# Patient Record
Sex: Female | Born: 1985 | Race: White | Hispanic: No | Marital: Single | Smoking: Never smoker
Health system: Southern US, Community
[De-identification: ages and names within clinical notes are randomized; demographics above are authoritative.]

## PROBLEM LIST (undated history)

## (undated) DIAGNOSIS — Q059 Spina bifida, unspecified: Secondary | ICD-10-CM

## (undated) HISTORY — PX: LUMBAR LAMINECTOMY FOR TETHERED CORD RELEASE: SHX1982

## (undated) HISTORY — PX: OTHER SURGICAL HISTORY: SHX169

---

## 2018-03-26 ENCOUNTER — Encounter (HOSPITAL_COMMUNITY): Payer: Self-pay

## 2018-03-26 ENCOUNTER — Emergency Department (HOSPITAL_COMMUNITY): Payer: Medicaid Other

## 2018-03-26 ENCOUNTER — Emergency Department (HOSPITAL_COMMUNITY)
Admission: EM | Admit: 2018-03-26 | Discharge: 2018-03-26 | Disposition: A | Discharge status: Home / Self Care | Payer: Medicaid Other | Attending: Emergency Medicine | Admitting: Emergency Medicine

## 2018-03-26 ENCOUNTER — Other Ambulatory Visit: Payer: Self-pay

## 2018-03-26 DIAGNOSIS — W1781XA Fall down embankment (hill), initial encounter: Secondary | ICD-10-CM | POA: Diagnosis not present

## 2018-03-26 DIAGNOSIS — R55 Syncope and collapse: Secondary | ICD-10-CM | POA: Insufficient documentation

## 2018-03-26 DIAGNOSIS — Y999 Unspecified external cause status: Secondary | ICD-10-CM | POA: Insufficient documentation

## 2018-03-26 DIAGNOSIS — Z91040 Latex allergy status: Secondary | ICD-10-CM | POA: Diagnosis not present

## 2018-03-26 DIAGNOSIS — S0990XA Unspecified injury of head, initial encounter: Secondary | ICD-10-CM

## 2018-03-26 DIAGNOSIS — W050XXA Fall from non-moving wheelchair, initial encounter: Secondary | ICD-10-CM | POA: Diagnosis not present

## 2018-03-26 DIAGNOSIS — S0083XA Contusion of other part of head, initial encounter: Secondary | ICD-10-CM | POA: Diagnosis present

## 2018-03-26 DIAGNOSIS — E876 Hypokalemia: Secondary | ICD-10-CM | POA: Diagnosis not present

## 2018-03-26 DIAGNOSIS — Y939 Activity, unspecified: Secondary | ICD-10-CM | POA: Insufficient documentation

## 2018-03-26 DIAGNOSIS — Y92838 Other recreation area as the place of occurrence of the external cause: Secondary | ICD-10-CM | POA: Insufficient documentation

## 2018-03-26 HISTORY — DX: Spina bifida, unspecified: Q05.9

## 2018-03-26 LAB — URINALYSIS, ROUTINE W REFLEX MICROSCOPIC
Bilirubin Urine: NEGATIVE
Glucose, UA: NEGATIVE mg/dL
KETONES UR: NEGATIVE mg/dL
LEUKOCYTES UA: NEGATIVE
Nitrite: NEGATIVE
PROTEIN: 30 mg/dL — AB
Specific Gravity, Urine: 1.018 (ref 1.005–1.030)
pH: 6 (ref 5.0–8.0)

## 2018-03-26 LAB — CBC WITH DIFFERENTIAL/PLATELET
BASOS ABS: 0 10*3/uL (ref 0.0–0.1)
Basophils Relative: 0 %
EOS PCT: 1 %
Eosinophils Absolute: 0.2 10*3/uL (ref 0.0–0.7)
HEMATOCRIT: 41.2 % (ref 36.0–46.0)
HEMOGLOBIN: 13.1 g/dL (ref 12.0–15.0)
LYMPHS ABS: 2.5 10*3/uL (ref 0.7–4.0)
LYMPHS PCT: 22 %
MCH: 28.4 pg (ref 26.0–34.0)
MCHC: 31.8 g/dL (ref 30.0–36.0)
MCV: 89.2 fL (ref 78.0–100.0)
Monocytes Absolute: 0.9 10*3/uL (ref 0.1–1.0)
Monocytes Relative: 8 %
NEUTROS ABS: 7.8 10*3/uL — AB (ref 1.7–7.7)
NEUTROS PCT: 69 %
Platelets: 351 10*3/uL (ref 150–400)
RBC: 4.62 MIL/uL (ref 3.87–5.11)
RDW: 14.4 % (ref 11.5–15.5)
WBC: 11.4 10*3/uL — ABNORMAL HIGH (ref 4.0–10.5)

## 2018-03-26 LAB — BASIC METABOLIC PANEL
ANION GAP: 7 (ref 5–15)
BUN: 10 mg/dL (ref 6–20)
CHLORIDE: 103 mmol/L (ref 98–111)
CO2: 26 mmol/L (ref 22–32)
Calcium: 8.5 mg/dL — ABNORMAL LOW (ref 8.9–10.3)
Creatinine, Ser: 0.41 mg/dL — ABNORMAL LOW (ref 0.44–1.00)
GFR calc Af Amer: >60 mL/min (ref >60)
GFR calc non Af Amer: >60 mL/min (ref >60)
Glucose, Bld: 102 mg/dL — ABNORMAL HIGH (ref 70–99)
POTASSIUM: 3.3 mmol/L — AB (ref 3.5–5.1)
SODIUM: 136 mmol/L (ref 135–145)

## 2018-03-26 MED ORDER — SODIUM CHLORIDE 0.9 % IV BOLUS
1000.0000 mL | Freq: Once | INTRAVENOUS | Status: AC
  Administered 2018-03-26: 1000 mL via INTRAVENOUS

## 2018-03-26 MED ORDER — POTASSIUM CHLORIDE CRYS ER 20 MEQ PO TBCR
20.0000 meq | EXTENDED_RELEASE_TABLET | Freq: Once | ORAL | Status: AC
  Administered 2018-03-26: 20 meq via ORAL
  Filled 2018-03-26: qty 1

## 2018-03-26 MED ORDER — IBUPROFEN 400 MG PO TABS
400.0000 mg | ORAL_TABLET | Freq: Once | ORAL | Status: AC
Start: 2018-03-26 — End: 2018-03-26
  Administered 2018-03-26: 400 mg via ORAL
  Filled 2018-03-26: qty 1

## 2018-03-26 NOTE — Discharge Instructions (Signed)
You were seen in the emergency department for a fainting spell in which you had a fall and facial injury.  You had a CAT scan of your head face and neck that did not show any obvious fractures.  We also did blood work and a urinalysis.  You were given IV fluids and some potassium because your potassium was a little low.  It will be important for you to follow-up with your primary care doctor because you will need further testing.  Please return if any concerns.

## 2018-03-26 NOTE — ED Notes (Signed)
IV by [facility] removed after unable to have flow of IV without pressure to site  BN, RN, CN with IV start

## 2018-03-26 NOTE — ED Provider Notes (Signed)
Pt is feeling better and is ready to go home.  She knows to return if worse.  F/u with pcp.   Jacalyn LefevreHaviland, Hilarie Sinha, MD 03/26/18 2225

## 2018-03-26 NOTE — ED Notes (Signed)
Dr H in to reassess

## 2018-03-26 NOTE — ED Provider Notes (Signed)
[facility] EMERGENCY DEPARTMENT Provider Note   CSN: [id number] Arrival date & time: 03/26/18  2035     History   Chief Complaint Chief Complaint  Patient presents with  . Fall    +LOC    HPI Jennifer Blake is a 32 y.o. female.  She is here after rolling down a hill and falling out of her wheelchair.  By standard states she was unconscious in the out place her back into the wheelchair.  Patient is unclear whether she passed out before or after the fall.  They also states she may have vomited.  Patient is wheelchair-bound due to spina bifida.  She is at a camp here where she is been outside a lot and doing a lot of physical activity.  Currently the patient denies any headache and only has some soreness of her upper lip and face.  She denies any chest pain shortness of breath headache double vision blurry vision nausea.  She states she is never passed out before.  At baseline she has no use of her legs and has to self catheterize.  The history is provided by the patient.  Fall  This is a new problem. The current episode started less than 1 hour ago. The problem has been gradually improving. Pertinent negatives include no chest pain, no abdominal pain, no headaches and no shortness of breath. Nothing aggravates the symptoms. The symptoms are relieved by rest. She has tried rest for the symptoms. The treatment provided mild relief.    History reviewed. No pertinent past medical history.  There are no active problems to display for this patient.   History reviewed. No pertinent surgical history.   OB History   None      Home Medications    Prior to Admission medications   Not on File    Family History No family history on file.  Social History Social History   Tobacco Use  . Smoking status: Not on file  Substance Use Topics  . Alcohol use: Not on file  . Drug use: Not on file     Allergies   Penicillins and Latex   Review of Systems Review of Systems    Constitutional: Negative for fever.  HENT: Negative for nosebleeds and sore throat.   Eyes: Negative for visual disturbance.  Respiratory: Negative for shortness of breath.   Cardiovascular: Negative for chest pain.  Gastrointestinal: Negative for abdominal pain.  Genitourinary: Negative for dysuria.  Musculoskeletal: Positive for neck pain. Negative for back pain.  Skin: Positive for wound. Negative for rash.  Neurological: Positive for syncope. Negative for headaches.     Physical Exam Updated Vital Signs BP (!) 137/91 (BP Location: Left Arm)   Pulse (!) 116   Temp 98.2 F (36.8 C) (Oral)   Resp 20   Ht 4\' 9"  (1.448 m)   Wt 63.5 kg   SpO2 100%   BMI 30.30 kg/m   Physical Exam  Constitutional: She is oriented to person, place, and time. She appears well-developed and well-nourished.  HENT:  Head: Normocephalic.  Right Ear: External ear normal.  Left Ear: External ear normal.  Nose: Nose normal.  Mouth/Throat: Oropharynx is clear and moist.  She has some swelling tenderness and abrasion to her upper lip and maxilla.  Eyes: Pupils are equal, round, and reactive to light. Conjunctivae and EOM are normal. Right eye exhibits no discharge. Left eye exhibits no discharge.  Neck: No tracheal deviation present.  Cardiovascular: Regular rhythm, normal heart sounds  and intact distal pulses. Tachycardia present.  Pulmonary/Chest: Effort normal. She has no wheezes. She has no rales.  Abdominal: Soft. She exhibits no mass. There is no tenderness. There is no guarding.  Musculoskeletal: Normal range of motion. She exhibits no tenderness or deformity.  Neurological: She is alert and oriented to person, place, and time. GCS eye subscore is 4. GCS verbal subscore is 5. GCS motor subscore is 6.  Skin: Skin is warm and dry. Capillary refill takes less than 2 seconds.  Psychiatric: She has a normal mood and affect.     ED Treatments / Results  Labs (all labs ordered are listed, but  only abnormal results are displayed) Labs Reviewed  BASIC METABOLIC PANEL - Abnormal; Notable for the following components:      Result Value   Potassium 3.3 (*)    Glucose, Bld 102 (*)    Creatinine, Ser 0.41 (*)    Calcium 8.5 (*)    All other components within normal limits  CBC WITH DIFFERENTIAL/PLATELET - Abnormal; Notable for the following components:   WBC 11.4 (*)    Neutro Abs 7.8 (*)    All other components within normal limits  URINALYSIS, ROUTINE W REFLEX MICROSCOPIC - Abnormal; Notable for the following components:   APPearance HAZY (*)    Hgb urine dipstick MODERATE (*)    Protein, ur 30 (*)    Bacteria, UA RARE (*)    All other components within normal limits    EKG EKG Interpretation  Date/Time:  Friday March 26 2018 21:30:17 EDT Ventricular Rate:  102 PR Interval:    QRS Duration: 88 QT Interval:  404 QTC Calculation: 527 R Axis:   55 Text Interpretation:  Sinus tachycardia Nonspecific T abnormalities, diffuse leads Prolonged QT interval Confirmed by Blane Ohara [phone]) on 03/27/2018 5:46:57 PM   Radiology Ct Head Wo Contrast  Result Date: 03/26/2018 CLINICAL DATA:  Status post syncope and fall out of wheelchair. Loss of consciousness. Facial injury and neck pain. Initial encounter. EXAM: CT HEAD WITHOUT CONTRAST CT MAXILLOFACIAL WITHOUT CONTRAST CT CERVICAL SPINE WITHOUT CONTRAST TECHNIQUE: Multidetector CT imaging of the head, cervical spine, and maxillofacial structures were performed using the standard protocol without intravenous contrast. Multiplanar CT image reconstructions of the cervical spine and maxillofacial structures were also generated. COMPARISON:  None. FINDINGS: CT HEAD FINDINGS Brain: No evidence of acute infarction, hemorrhage, hydrocephalus, extra-axial collection or mass lesion / mass effect. The appearance of the lateral ventricles appears to be developmental in nature, with chronic hydrocephalus. 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 Prominent low-lying cerebellar tonsils are noted. The brainstem and fourth ventricle are within normal limits. The basal ganglia are unremarkable in appearance. The cerebral hemispheres demonstrate grossly normal gray-white differentiation. No mass effect or midline shift is seen. Vascular: No hyperdense vessel or unexpected calcification. Skull: There is no evidence of fracture; visualized osseous structures are unremarkable in appearance. Other: No significant soft tissue abnormalities are seen. CT MAXILLOFACIAL FINDINGS Osseous: There is no evidence of fracture or dislocation. The maxilla and mandible appear intact. The nasal bone is unremarkable in appearance. The visualized dentition demonstrates no acute abnormality. Orbits: The orbits are intact bilaterally. Sinuses: The visualized paranasal sinuses and mastoid air cells are well-aerated. Soft tissues: No significant soft tissue abnormalities are seen. The parapharyngeal fat planes are preserved. The nasopharynx, oropharynx and hypopharynx are unremarkable in appearance. The visualized portions of the valleculae and piriform sinuses are grossly unremarkable. The parotid and  submandibular glands are within normal limits. No cervical lymphadenopathy is seen. CT CERVICAL SPINE FINDINGS Alignment: Normal. Skull base and vertebrae: No acute fracture. No primary bone lesion or focal pathologic process. Soft tissues and spinal canal: No prevertebral fluid or swelling. No visible canal hematoma. Disc levels: Intervertebral disc spaces are preserved. Scattered anterior and posterior disc osteophyte complexes are noted along the cervical spine. Upper chest: The visualized lung apices are clear. The thyroid gland is unremarkable. Other: No additional soft tissue abnormalities are seen. IMPRESSION: 1. No evidence of traumatic intracranial injury or fracture. 2. No evidence of fracture or subluxation along the cervical  spine. 3. No evidence of fracture or dislocation with regard to the maxillofacial structures. 4. Chronic hydrocephalus noted, with prominent low-lying cerebellar tonsils. 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 5. Minimal degenerative change along the cervical spine. Electronically Signed   By: Roanna RaiderJeffery  Chang M.D.   On: 03/26/2018 21:31   Ct Cervical Spine Wo Contrast  Result Date: 03/26/2018 CLINICAL DATA:  Status post syncope and fall out of wheelchair. Loss of consciousness. Facial injury and neck pain. Initial encounter. EXAM: CT HEAD WITHOUT CONTRAST CT MAXILLOFACIAL WITHOUT CONTRAST CT CERVICAL SPINE WITHOUT CONTRAST TECHNIQUE: Multidetector CT imaging of the head, cervical spine, and maxillofacial structures were performed using the standard protocol without intravenous contrast. Multiplanar CT image reconstructions of the cervical spine and maxillofacial structures were also generated. COMPARISON:  None. FINDINGS: CT HEAD FINDINGS Brain: No evidence of acute infarction, hemorrhage, hydrocephalus, extra-axial collection or mass lesion / mass effect. The appearance of the lateral ventricles appears to be developmental in nature, with chronic hydrocephalus. A right-sided ventriculoperitoneal shunt is noted, ending at the left lateral ventricle. Prominent low-lying cerebellar tonsils are noted. The brainstem and fourth ventricle are within normal limits. The basal ganglia are unremarkable in appearance. The cerebral hemispheres demonstrate grossly normal gray-white differentiation. No mass effect or midline shift is seen. Vascular: No hyperdense vessel or unexpected calcification. Skull: There is no evidence of fracture; visualized osseous structures are unremarkable in appearance. Other: No significant soft tissue abnormalities are seen. CT MAXILLOFACIAL FINDINGS Osseous: There is no evidence of fracture or dislocation. The maxilla and mandible appear intact. The  nasal bone is unremarkable in appearance. The visualized dentition demonstrates no acute abnormality. Orbits: The orbits are intact bilaterally. Sinuses: The visualized paranasal sinuses and mastoid air cells are well-aerated. Soft tissues: No significant soft tissue abnormalities are seen. The parapharyngeal fat planes are preserved. The nasopharynx, oropharynx and hypopharynx are unremarkable in appearance. The visualized portions of the valleculae and piriform sinuses are grossly unremarkable. The parotid and submandibular glands are within normal limits. No cervical lymphadenopathy is seen. CT CERVICAL SPINE FINDINGS Alignment: Normal. Skull base and vertebrae: No acute fracture. No primary bone lesion or focal pathologic process. Soft tissues and spinal canal: No prevertebral fluid or swelling. No visible canal hematoma. Disc levels: Intervertebral disc spaces are preserved. Scattered anterior and posterior disc osteophyte complexes are noted along the cervical spine. Upper chest: The visualized lung apices are clear. The thyroid gland is unremarkable. Other: No additional soft tissue abnormalities are seen. IMPRESSION: 1. No evidence of traumatic intracranial injury or fracture. 2. No evidence of fracture or subluxation along the cervical spine. 3. No evidence of fracture or dislocation with regard to the maxillofacial structures. 4. Chronic hydrocephalus noted, with prominent low-lying cerebellar tonsils. 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 5. Minimal degenerative change along the cervical spine. Electronically Signed  By: Roanna Raider M.D.   On: 03/26/2018 21:31   Ct Maxillofacial Wo Cm  Result Date: 03/26/2018 CLINICAL DATA:  Status post syncope and fall out of wheelchair. Loss of consciousness. Facial injury and neck pain. Initial encounter. EXAM: CT HEAD WITHOUT CONTRAST CT MAXILLOFACIAL WITHOUT CONTRAST CT CERVICAL SPINE WITHOUT CONTRAST TECHNIQUE:  Multidetector CT imaging of the head, cervical spine, and maxillofacial structures were performed using the standard protocol without intravenous contrast. Multiplanar CT image reconstructions of the cervical spine and maxillofacial structures were also generated. COMPARISON:  None. FINDINGS: CT HEAD FINDINGS Brain: No evidence of acute infarction, hemorrhage, hydrocephalus, extra-axial collection or mass lesion / mass effect. The appearance of the lateral ventricles appears to be developmental in nature, with chronic hydrocephalus. A right-sided ventriculoperitoneal shunt is noted, ending at the left lateral ventricle. Prominent low-lying cerebellar tonsils are noted. The brainstem and fourth ventricle are within normal limits. The basal ganglia are unremarkable in appearance. The cerebral hemispheres demonstrate grossly normal gray-white differentiation. No mass effect or midline shift is seen. Vascular: No hyperdense vessel or unexpected calcification. Skull: There is no evidence of fracture; visualized osseous structures are unremarkable in appearance. Other: No significant soft tissue abnormalities are seen. CT MAXILLOFACIAL FINDINGS Osseous: There is no evidence of fracture or dislocation. The maxilla and mandible appear intact. The nasal bone is unremarkable in appearance. The visualized dentition demonstrates no acute abnormality. Orbits: The orbits are intact bilaterally. Sinuses: The visualized paranasal sinuses and mastoid air cells are well-aerated. Soft tissues: No significant soft tissue abnormalities are seen. The parapharyngeal fat planes are preserved. The nasopharynx, oropharynx and hypopharynx are unremarkable in appearance. The visualized portions of the valleculae and piriform sinuses are grossly unremarkable. The parotid and submandibular glands are within normal limits. No cervical lymphadenopathy is seen. CT CERVICAL SPINE FINDINGS Alignment: Normal. Skull base and vertebrae: No acute  fracture. No primary bone lesion or focal pathologic process. Soft tissues and spinal canal: No prevertebral fluid or swelling. No visible canal hematoma. Disc levels: Intervertebral disc spaces are preserved. Scattered anterior and posterior disc osteophyte complexes are noted along the cervical spine. Upper chest: The visualized lung apices are clear. The thyroid gland is unremarkable. Other: No additional soft tissue abnormalities are seen. IMPRESSION: 1. No evidence of traumatic intracranial injury or fracture. 2. No evidence of fracture or subluxation along the cervical spine. 3. No evidence of fracture or dislocation with regard to the maxillofacial structures. 4. Chronic hydrocephalus noted, with prominent low-lying cerebellar tonsils. 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 5. Minimal degenerative change along the cervical spine. Electronically Signed   By: Roanna Raider M.D.   On: 03/26/2018 21:31    Procedures Procedures (including critical care time)  Medications Ordered in ED Medications  sodium chloride 0.9 % bolus 1,000 mL (has no administration in time range)     Initial Impression / Assessment and Plan / ED Course  I have reviewed the triage vital signs and the nursing notes.  Pertinent labs & imaging results that were available during my care of the patient were reviewed by me and considered in my medical decision making (see chart for details).  Clinical Course as of Mar 29 835  Caleen Essex Mar 26, 2018  2135 CT of head C-spine and max face are negative for any acute fractures.  Chronic hydrocephalus is noted with the VP shunt in place.   [MB]  2141 EKG is sinus tachycardia at a rate of 102.  Prolonged QT.  Nonspecific T waves.  No prior EKGs available although I saw the report from 2017 that was normal sinus rhythm nonspecific T abnormalities.   [MB]    Clinical Course User Index [MB] Terrilee Files, MD     Final Clinical  Impressions(s) / ED Diagnoses   Final diagnoses:  Contusion of face, initial encounter  Syncope, unspecified syncope type  Injury of head, initial encounter  Hypokalemia    ED Discharge Orders    None       Terrilee Files, MD 03/28/18 [phone]

## 2018-03-26 NOTE — ED Notes (Signed)
To Rad 

## 2018-03-26 NOTE — ED Notes (Signed)
[place] at [employer] and "[place] out" [place]  [place] on [place]  Pos LOC   Conversant and neuro intact

## 2018-03-26 NOTE — ED Triage Notes (Addendum)
Pt is wheelchair-bound due to spina bifida.  Pt was at [employer] tonight when per bystanders, she passed out and her wheelchair rolled down a hill and she fell out.  Pt and witnesses state she had positive loc for several minutes.  Pt has facial injury and some neck pain.  Pt arrives via [facility] with c-collar in place.

## 2018-03-28 ENCOUNTER — Encounter (HOSPITAL_COMMUNITY): Payer: Self-pay | Admitting: Emergency Medicine

## 2019-08-04 IMAGING — CT CT CERVICAL SPINE W/O CM
4 of 10 series · 9 of 33 positions shown, 10 images · non-contrast
Comparison: None.

CLINICAL DATA: Status post syncope and fall out of wheelchair. Loss
of consciousness. Facial injury and neck pain. Initial encounter.

EXAM:
CT HEAD WITHOUT CONTRAST
CT MAXILLOFACIAL WITHOUT CONTRAST
CT CERVICAL SPINE WITHOUT CONTRAST
TECHNIQUE: Multidetector CT imaging of the head, cervical spine, and
maxillofacial structures were performed using the standard protocol
without intravenous contrast. Multiplanar CT image reconstructions
of the cervical spine and maxillofacial structures were also
generated.

[Series 7: max soft · axial · 0.44mm/px · z∈[+1420,+1516]mm · 3 of 98 slices shown]
[im 25/98  soft-tissue]
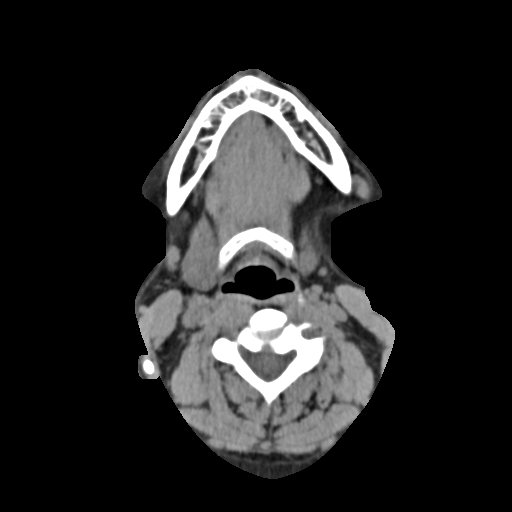
[im 49/98  soft-tissue]
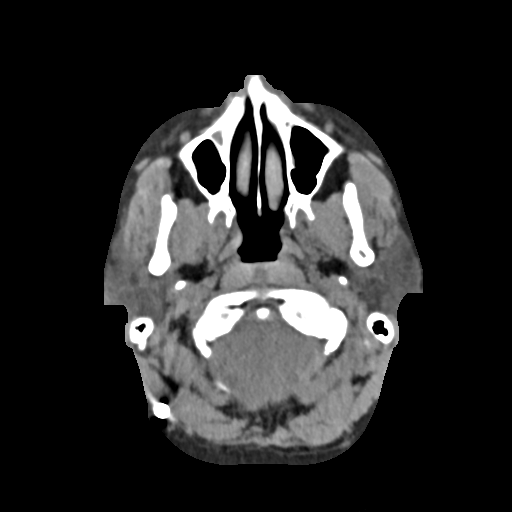
[im 73/98  soft-tissue]
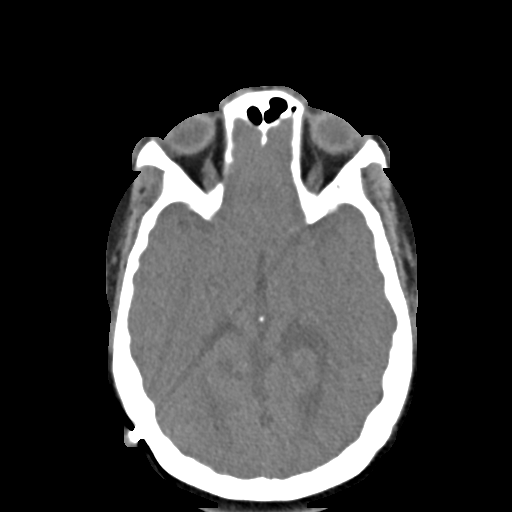

[Series 16: c spine soft · axial · 0.28mm/px · z∈[+1392,+1448]mm · 2 of 86 slices shown]
[im 29/86  soft-tissue]
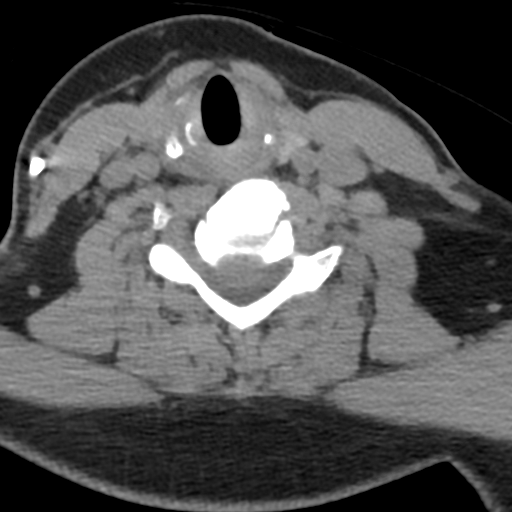
[im 57/86  soft-tissue]
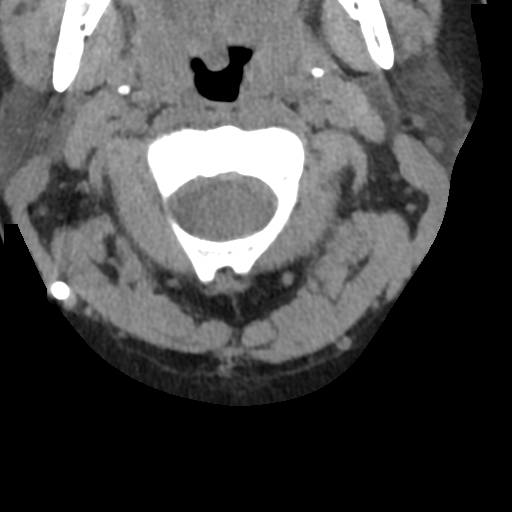

[Series 17: sagittal bone · sagittal · 0.23mm/px · 2 of 61 slices shown]
[im 21/61  bone]
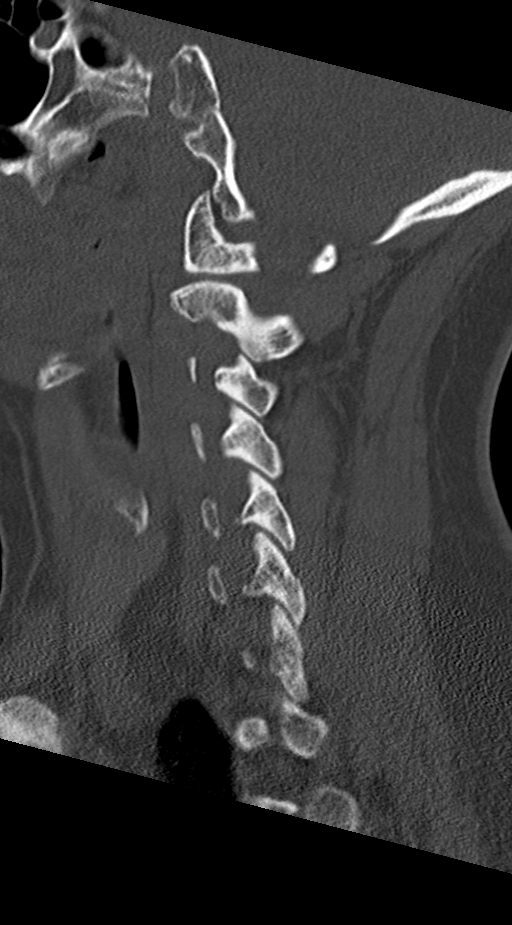
[im 41/61  bone]
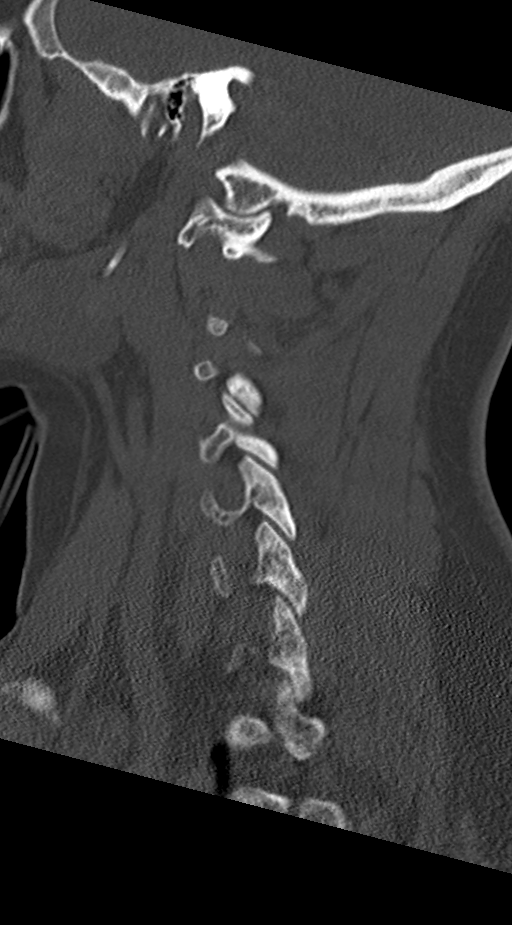

[Series 19: orthogonal axials · axial · 0.21mm/px · z∈[+1368,+1419]mm · 2 of 82 slices shown, 3 images]
[im 28/82  soft-tissue]
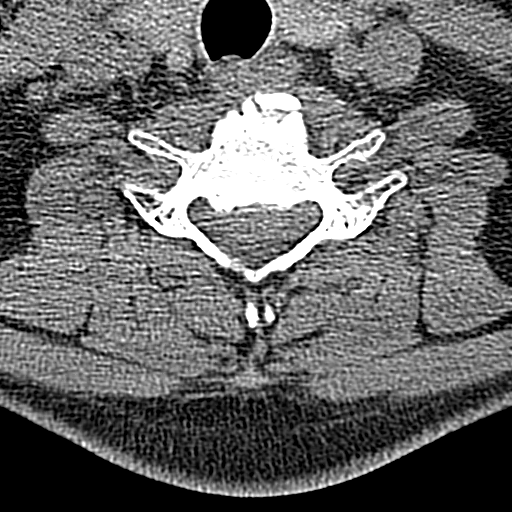
[im 28/82  bone]
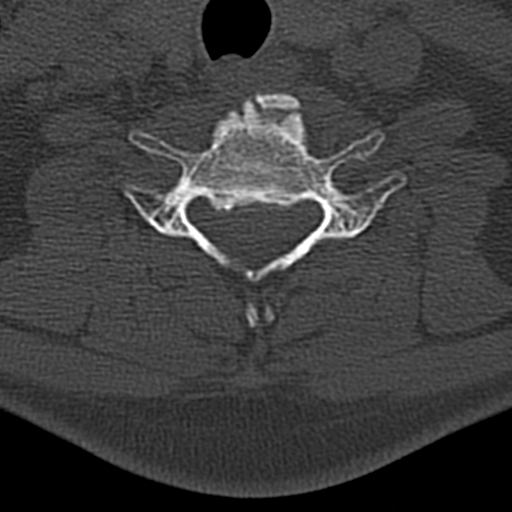
[im 55/82  bone]
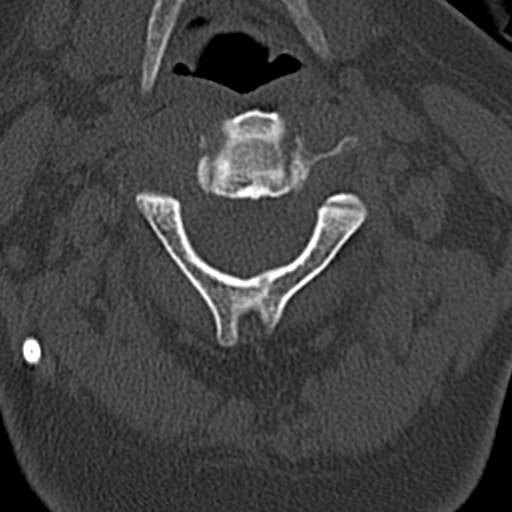

[9 of 33 positions shown; findings below may reference images not displayed]

FINDINGS: CT HEAD FINDINGS

Brain: No evidence of acute infarction, hemorrhage, hydrocephalus,
extra-axial collection or mass lesion / mass effect.

The appearance of the lateral ventricles appears to be developmental
in nature, with chronic hydrocephalus. A right-sided
ventriculoperitoneal shunt is noted, ending at the left lateral
ventricle.

Prominent low-lying cerebellar tonsils are noted.

The brainstem and fourth ventricle are within normal limits. The
basal ganglia are unremarkable in appearance. The cerebral
hemispheres demonstrate grossly normal gray-white differentiation.
No mass effect or midline shift is seen.

Vascular: No hyperdense vessel or unexpected calcification.

Skull: There is no evidence of fracture; visualized osseous
structures are unremarkable in appearance.

Other: No significant soft tissue abnormalities are seen.

CT MAXILLOFACIAL FINDINGS

Osseous: There is no evidence of fracture or dislocation. The
maxilla and mandible appear intact. The nasal bone is unremarkable
in appearance. The visualized dentition demonstrates no acute
abnormality.

Orbits: The orbits are intact bilaterally.

Sinuses: The visualized paranasal sinuses and mastoid air cells are
well-aerated.

Soft tissues: No significant soft tissue abnormalities are seen. The
parapharyngeal fat planes are preserved. The nasopharynx, oropharynx
and hypopharynx are unremarkable in appearance. The visualized
portions of the valleculae and piriform sinuses are grossly
unremarkable.

The parotid and submandibular glands are within normal limits. No
cervical lymphadenopathy is seen.

CT CERVICAL SPINE FINDINGS

Alignment: Normal.

Skull base and vertebrae: No acute fracture. No primary bone lesion
or focal pathologic process.

Soft tissues and spinal canal: No prevertebral fluid or swelling. No
visible canal hematoma.

Disc levels: Intervertebral disc spaces are preserved. Scattered
anterior and posterior disc osteophyte complexes are noted along the
cervical spine.

Upper chest: The visualized lung apices are clear. The thyroid gland
is unremarkable.

Other: No additional soft tissue abnormalities are seen.
IMPRESSION: 1. No evidence of traumatic intracranial injury or fracture.
2. No evidence of fracture or subluxation along the cervical spine.
3. No evidence of fracture or dislocation with regard to the
maxillofacial structures.
4. Chronic hydrocephalus noted, with prominent low-lying cerebellar
tonsils. Right-sided ventriculoperitoneal shunt noted, ending at the
left lateral ventricle.
5. Minimal degenerative change along the cervical spine.
